# Patient Record
Sex: Female | Born: 1955 | Race: White | Hispanic: No | Marital: Married | Smoking: Never smoker
Health system: Southern US, Community
[De-identification: ages and names within clinical notes are randomized; demographics above are authoritative.]

## PROBLEM LIST (undated history)

## (undated) DIAGNOSIS — C801 Malignant (primary) neoplasm, unspecified: Secondary | ICD-10-CM

## (undated) HISTORY — DX: Malignant (primary) neoplasm, unspecified: C80.1

---

## 2000-07-29 ENCOUNTER — Other Ambulatory Visit: Admission: RE | Admit: 2000-07-29 | Discharge: 2000-07-29 | Payer: Self-pay | Admitting: Internal Medicine

## 2001-08-31 ENCOUNTER — Other Ambulatory Visit: Admission: RE | Admit: 2001-08-31 | Discharge: 2001-08-31 | Payer: Self-pay | Admitting: Internal Medicine

## 2001-09-10 ENCOUNTER — Encounter: Payer: Self-pay | Admitting: Internal Medicine

## 2001-09-10 ENCOUNTER — Encounter: Admission: RE | Admit: 2001-09-10 | Discharge: 2001-09-10 | Payer: Self-pay | Admitting: Internal Medicine

## 2001-10-06 HISTORY — PX: ABDOMINAL HYSTERECTOMY: SHX81

## 2001-11-03 ENCOUNTER — Encounter (INDEPENDENT_AMBULATORY_CARE_PROVIDER_SITE_OTHER): Payer: Self-pay | Admitting: Specialist

## 2001-11-03 ENCOUNTER — Inpatient Hospital Stay (HOSPITAL_COMMUNITY): Admission: RE | Admit: 2001-11-03 | Discharge: 2001-11-04 | Payer: Self-pay | Admitting: Obstetrics and Gynecology

## 2002-12-09 ENCOUNTER — Encounter (INDEPENDENT_AMBULATORY_CARE_PROVIDER_SITE_OTHER): Payer: Self-pay | Admitting: *Deleted

## 2002-12-09 ENCOUNTER — Ambulatory Visit (HOSPITAL_BASED_OUTPATIENT_CLINIC_OR_DEPARTMENT_OTHER): Admission: RE | Admit: 2002-12-09 | Discharge: 2002-12-09 | Payer: Self-pay | Admitting: Plastic Surgery

## 2005-09-02 ENCOUNTER — Other Ambulatory Visit: Admission: RE | Admit: 2005-09-02 | Discharge: 2005-09-02 | Payer: Self-pay | Admitting: Internal Medicine

## 2006-12-16 ENCOUNTER — Other Ambulatory Visit: Admission: RE | Admit: 2006-12-16 | Discharge: 2006-12-16 | Payer: Self-pay | Admitting: Internal Medicine

## 2007-07-29 ENCOUNTER — Ambulatory Visit: Payer: Self-pay | Admitting: Oncology

## 2007-08-13 LAB — CBC WITH DIFFERENTIAL/PLATELET
BASO%: 0.4 % (ref 0.0–2.0)
Basophils Absolute: 0 10*3/uL (ref 0.0–0.1)
Eosinophils Absolute: 0 10*3/uL (ref 0.0–0.5)
HCT: 37.4 % (ref 34.8–46.6)
HGB: 12.7 g/dL (ref 11.6–15.9)
MONO#: 0.3 10*3/uL (ref 0.1–0.9)
NEUT%: 51.2 % (ref 39.6–76.8)
Platelets: 208 10*3/uL (ref 145–400)
WBC: 3.6 10*3/uL — ABNORMAL LOW (ref 3.9–10.0)
lymph#: 1.5 10*3/uL (ref 0.9–3.3)

## 2007-08-13 LAB — COMPREHENSIVE METABOLIC PANEL
ALT: 21 U/L (ref 0–35)
BUN: 12 mg/dL (ref 6–23)
CO2: 28 mEq/L (ref 19–32)
Calcium: 9 mg/dL (ref 8.4–10.5)
Chloride: 107 mEq/L (ref 96–112)
Creatinine, Ser: 0.68 mg/dL (ref 0.40–1.20)
Glucose, Bld: 96 mg/dL (ref 70–99)

## 2007-08-31 ENCOUNTER — Ambulatory Visit (HOSPITAL_COMMUNITY): Admission: RE | Admit: 2007-08-31 | Discharge: 2007-08-31 | Payer: Self-pay | Admitting: Oncology

## 2007-09-01 ENCOUNTER — Ambulatory Visit (HOSPITAL_COMMUNITY): Admission: RE | Admit: 2007-09-01 | Discharge: 2007-09-01 | Payer: Self-pay | Admitting: General Surgery

## 2007-10-02 ENCOUNTER — Ambulatory Visit (HOSPITAL_COMMUNITY): Admission: RE | Admit: 2007-10-02 | Discharge: 2007-10-02 | Payer: Self-pay | Admitting: General Surgery

## 2007-10-02 ENCOUNTER — Encounter (INDEPENDENT_AMBULATORY_CARE_PROVIDER_SITE_OTHER): Payer: Self-pay | Admitting: General Surgery

## 2007-10-06 ENCOUNTER — Ambulatory Visit: Payer: Self-pay | Admitting: Oncology

## 2008-07-12 ENCOUNTER — Other Ambulatory Visit: Admission: RE | Admit: 2008-07-12 | Discharge: 2008-07-12 | Payer: Self-pay | Admitting: Internal Medicine

## 2008-07-12 ENCOUNTER — Ambulatory Visit: Payer: Self-pay | Admitting: Internal Medicine

## 2009-03-17 ENCOUNTER — Ambulatory Visit: Payer: Self-pay | Admitting: Internal Medicine

## 2009-07-03 IMAGING — CT NM PET TUM IMG WHOLE BODY
6 series · 25 of 25 positions shown · IV contrast ([ID])
Comparison: none

CLINICAL DATA: Melanoma staging. 
FDG PET-CT TUMOR IMAGING (WHOLE BODY):
TECHNIQUE: 18.5 mCi F-18 FDG was injected intravenously via the right antecubital fossa.  Full-ring PET imaging was performed from the skull vertex through the lower extremities 90 minutes after injection.  CT data was obtained and used for attenuation correction and anatomic localization only.  (This was not acquired as a diagnostic CT examination.)

[Series 1: pet ac legs · axial · 3.3mm · 4.69mm/px · z∈[+120,+990]mm · 5 of 267 slices shown]
[im 1/267]
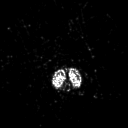
[im 67/267]
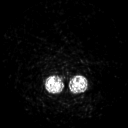
[im 134/267]
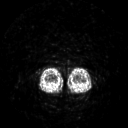
[im 200/267]
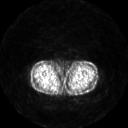
[im 267/267]
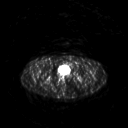

[Series 2: ct legs · axial · 3.8mm · 0.98mm/px · z∈[+120,+990]mm · 6 of 267 slices shown]
[im 1/267  soft-tissue]
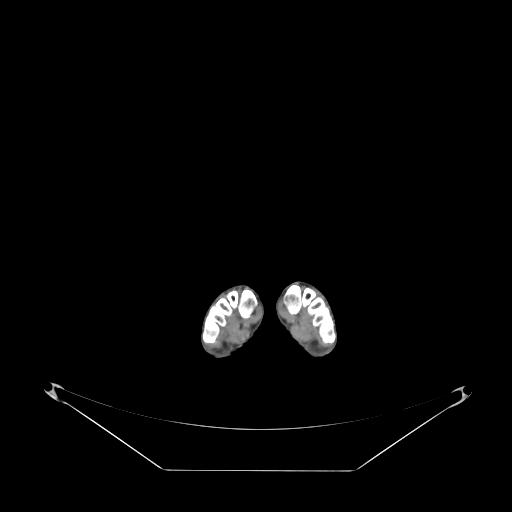
[im 54/267  soft-tissue]
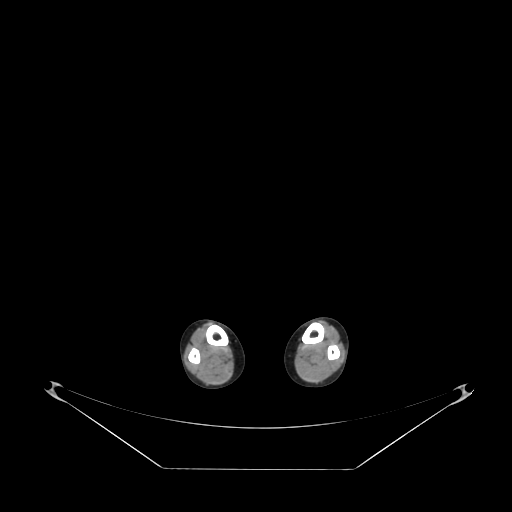
[im 107/267  soft-tissue]
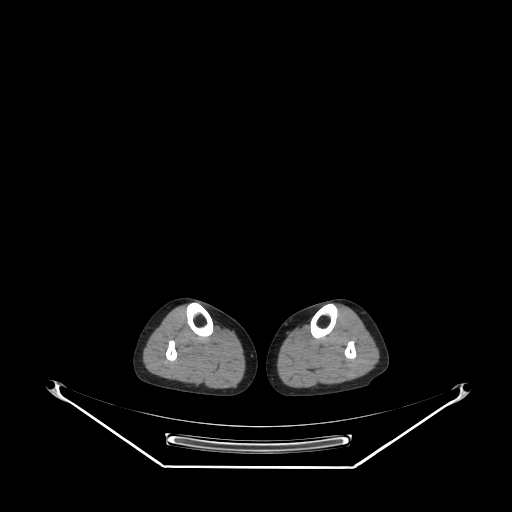
[im 160/267  soft-tissue]
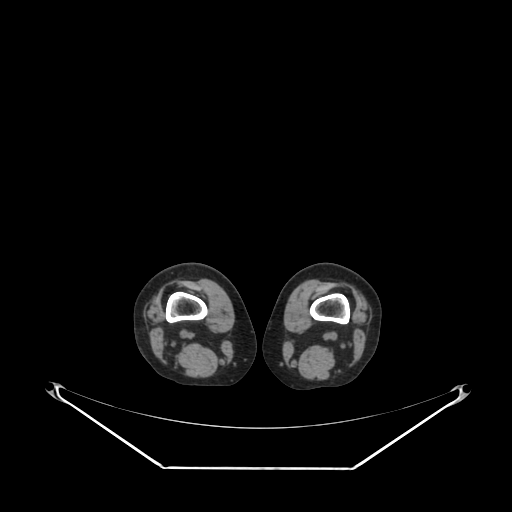
[im 213/267  soft-tissue]
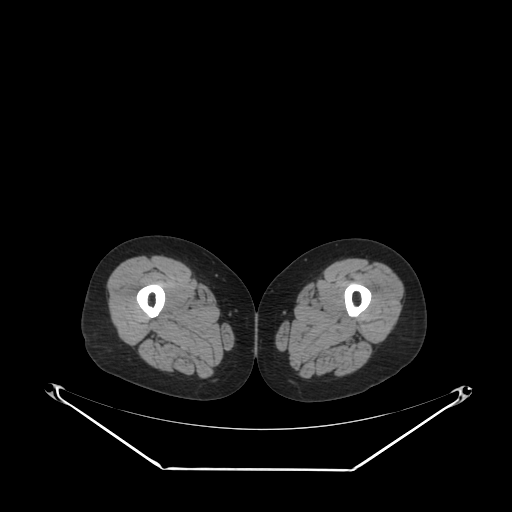
[im 267/267  soft-tissue]
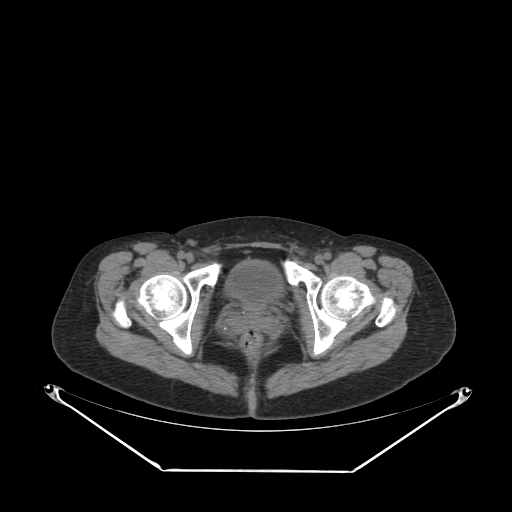

[Series 2: pet nac legs · axial · 3.3mm · 4.69mm/px · z∈[+120,+990]mm · 6 of 267 slices shown]
[im 1/267]
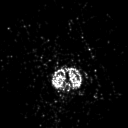
[im 54/267]
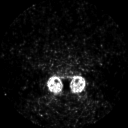
[im 107/267]
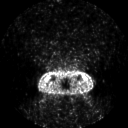
[im 160/267]
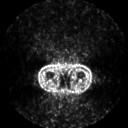
[im 213/267]
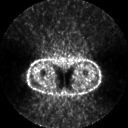
[im 267/267]
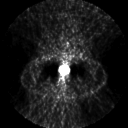

[Series 123: mip · coronal · 3.3mm · 4.69mm/px · 1 of 30 slices shown]
[im 1/30]
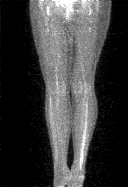

[Series 151: reformatted · axial · 3.3mm · 3.91mm/px · z∈[+134,+974]mm · 6 of 258 slices shown (1 of 2)]
[im 1/258]
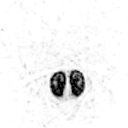
[im 52/258]
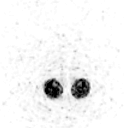
[im 103/258]
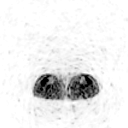
[im 155/258]
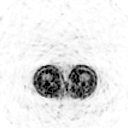
[im 206/258]
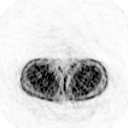
[im 258/258]
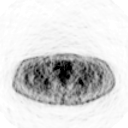

[Series 153: reformatted · coronal · 4.7mm · 6.98mm/px · 1 of 38 slices shown (2 of 2)]
[im 1/38]
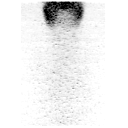

[25 of 25 positions shown; findings below may reference images not displayed]

FINDINGS: By report, the patient had melanoma of the lateral aspect of the left leg.  The technologist reported that this was in the distal leg, just above the ankle, but on today?s PET scan, there is a linear focus of increased FDG uptake in the skin and superficial subcutaneous fat of the more proximal aspect of the lateral left leg, just distal to the knee.  Given the linear configuration, I suspect this uptake is related to healing of the resection site.  However, clinical correlation is recommended.  Elsewhere in the neck, chest, abdomen, and pelvis, there is a focal area of FDG accumulation along the left pelvic side wall.  This corresponds to image 224 of the CT data.  The activity is superimposed on the left ureter as it courses along the left pelvic side wall and I can identify no lymph node in this area.  For this reason, I believe the activity is related to urine within the distal ureter rather than metastatic spread to a pelvic side wall lymph node.  Of note, there are no hypermetabolic lymph nodes in the proximal left thigh or left inguinal region. 
No other areas of unexpected or suspicious FDG accumulation are seen in the neck, chest, abdomen, or pelvis.  
Although not a diagnostic CT study, the patient does have bilateral tiny pulmonary nodules.  A right middle lobe and left lower lobe pulmonary nodule are both visible on image 123 of the CT scan.  These are below the expected size threshold for PET resolution.  1 cm hypodense lesion in the right kidney is not hypermetabolic on the PET scan.  Bilateral pars interarticularis defects are seen at L5.
IMPRESSION: 1.  There is a linear focus of hypermetabolic uptake in the skin and subcutaneous superficial fat over the anterolateral aspect of the proximal left leg.  This may correspond to the patient?s resection site, but clinical correlation is recommended. 
2.  Single focus of FDG accumulation above background levels along the left pelvic side wall is felt to be excreted contrast within the distal left ureter. 
3.  Tiny bilateral pulmonary parenchymal nodules, too small to characterize and below the size threshold for PET resolution.

## 2010-07-29 ENCOUNTER — Encounter: Payer: Self-pay | Admitting: Oncology

## 2010-11-20 NOTE — Op Note (Signed)
NAMEALAINNA, Valerie Eaton              ACCOUNT NO.:  [id number]   MEDICAL RECORD NO.:  [id number]          PATIENT TYPE:  AMB   LOCATION:  SDS                          FACILITY:  [facility]   PHYSICIAN:  Gabrielle Dare. Janee Morn, M.D.DATE OF BIRTH:  03/28/1956   DATE OF PROCEDURE:  10/02/2007  DATE OF DISCHARGE:                               OPERATIVE REPORT   PREOPERATIVE DIAGNOSES:  Melanoma, left calf, status post wide excision.   POSTOPERATIVE DIAGNOSES:  Melanoma, left calf, status post wide  excision.   PROCEDURE:  Left inguinal and sentinel lymph node biopsy with blue dye  injection.   SURGEON:  Gabrielle Dare. Janee Morn, M.D.   ANESTHESIA:  General with laryngeal mask airway.   HISTORY OF PRESENT ILLNESS:  Valerie Eaton is a 55 year old female, who  underwent wide excision of a melanoma on her left calf by Dr. Benna Dunks  from plastic surgery.  Pathology revealed clear margins.  However, the  thickness of the melanoma was 1.03 mm.  She was evaluated by Dr.  Darnelle Catalan from oncology and I was asked to do a sentinel lymph node  biopsy.  She underwent lymph scintigram, which demonstrated sentinel  lymph node in the left inguinal region, so we are proceeding today with  elective left inguinal sentinel lymph node biopsy.  She was injected  with a radionucleotide this morning.   PROCEDURE IN DETAIL:  Informed consent was obtained.  Patient received  intravenous antibiotics.  Her site was marked.  She was brought to the  operating room.  General anesthesia with laryngeal mask airway was  administered by the anesthesia staff.  Diluted methylene blue was  injected cutaneously around her wide excision site and then massaged  gently for four minutes.  We injected a total of 3 mL.  The Neoprobe was  then used to confirm injection of the radionucleotide at the previous  wide-excision site and to localize a spot in the groin on the left side.  Left groin was then prepped and draped in sterile fashion.  Neoprobe  was  again used to localize a hot node location.  Subsequently, a transverse  incision was made along the tissue plane lines.  Subcutaneous tissues  were dissected down through Scarpa's fascia.  We then used the Neoprobe  to guide our dissection and a blue-tinged lymph node was located easily.  This was circumferentially dissected and hemostasis was obtained with  the Bovie cautery.  This was sent to pathology as a hot blue node.  Neoprobe readings were about 325.  Further exploration of this incision  in the surrounding groin area revealed no other signal above 4, so this  completed the procedure.  The area was  irrigated; 0.25% Marcaine with epinephrine was injected for local  anesthetic.  Hemostasis was insured.  Wound was closed with a running 4-  0 Monocryl subcuticular stitch, followed by Dermabond.  The patient  tolerated the procedure well, without apparent complication and was  taken to the recovery room in stable condition.      Gabrielle Dare Janee Morn, M.D.  Electronically Signed     BET/MEDQ  D:  10/02/2007  T:  10/02/2007  Job:  [id number]   cc:   Alfredia Ferguson, M.D.  Valentino Hue. Magrinat, M.D.

## 2010-11-23 NOTE — Op Note (Signed)
[facility]  Patient:    Valerie Eaton, Valerie Eaton Visit Number: [id number] MRN: [id number]          Service Type: GYN Location: [id number] Attending Physician:  Jenean Lindau Dictated by:   Laqueta Linden, M.D. Proc. Date: 11/03/01 Admit Date:  11/03/2001   CC:         Luanna Cole. Lenord Fellers, M.D.   Operative Report  PREOPERATIVE DIAGNOSES:       Leiomyomata uteri.  POSTOPERATIVE DIAGNOSES:      Leiomyomata uteri.  PROCEDURE:                    Total abdominal hysterectomy.  SURGEON:                      Laqueta Linden, M.D.  ASSISTANT:                    Darden Dates. Clelia Croft, M.D.  ANESTHESIA:                   Epidural/spinal with IV sedation.  ESTIMATED BLOOD LOSS:         150 cc.  URINE OUTPUT:                 650 cc.  FLUIDS:                       2 L Crystalloid.  COUNTS:                       Correct x2.  COMPLICATIONS:                None.  INDICATIONS:                  [name] is a 55 year old nulligravida female whose husband is status post vasectomy who presented upon referral from Dr. Lenord Fellers for a large fibroid uterus which reportedly increased in size over a one year period of time.  She noted pelvic pressure and heaviness as well as a full abdomen, urinary frequency, and worsening menorrhagia over the past six to seven months.  She reportedly had a normal yearly examination one year previously.  At the time she presented in March 2003 she was noted to have a 16-18 week sized uterus with ultrasound confirming a large exophytic fibroid measuring 8 x 10.7 x 10.2 cm with a grossly enlarged uterus and multiple other fibroids.  Patient was counseled regarding management alternatives including embolization versus hysterectomy. She elected to proceed to definitive surgical management.  She has seen the informed consent film and been extensively counseled as to the risks, benefits, alternatives, and complications of the  procedure including, but not limited to, anesthesia risks, infection, bleeding possibly requiring transfusion, injury to bowel, bladder, ureters, vessels, nerves, the possibility of fistula formation, other surgical risks including DVT, PE, pneumonia, death as well as postoperative expectations regarding sexual functioning, eventual menopause, and return to work.  She has seen the informed consent film, has voiced her understanding and acceptance of all risks, and agrees to proceed.  She received Ancef 1 g IV antibiotic prophylaxis preoperatively.  PROCEDURE:                    The patient was taken to the operating room and after proper identification and consents were ascertained she was placed on the operating table in  supine position.  A spinal/epidural was placed by Dr. Arby Barrette at which point the patient was returned to the supine position. After an adequate level of anesthesia was ascertained she was placed in the frog leg position and the abdomen, perineum, and vagina were prepped and draped in a routine sterile fashion.  PAS stockings were in place.  A transurethral Foley was placed.  A low transverse incision was then made just above the pubic symphysis.  This was carried down to the level of the anterior rectus fascia.  The fascia was incised and the rectus muscles separated.  The parietoperitoneum was elevated and incised and the incision extended superiorly and inferiorly to the level of the bladder.  Gentle palpation of the upper abdomen revealed smooth renal contours bilaterally with smooth liver edge and no obvious gallstones.  The appendix was retrocecal, but was visualized in its entirety and palpated with no obvious stones or adhesions identified.  Self retaining retractor was placed and the bowel packed in the upper abdomen using moistened lap packs.  The uterus was noted to be distorted by a very large fibroid which consumed the fundus of the uterus.  Both tubes and  ovaries appeared completely normal.  A corkscrew was inserted into the fundus of the uterus to allow the fundus to be delivered through the incision which markedly improved visualization of the attaching ligaments.  The round ligaments bilaterally were clamped, cut, and suture ligated with 0 Vicryl suture.  The broad ligament was then opened anteriorly and posteriorly with visualization of the ureters bilaterally which were of normal caliber and peristalsing normally.  A window was made in the posterior broad ligament and a curved Kelly clamp placed across the proximal utero-ovarian ligament and fallopian tube on both sides with retention of both adnexa.  This pedicle was cut and doubly ligated with a free tie and a stitch of 0 Vicryl.  At the end of the procedure the adnexa were suspended to the ipsilateral round ligaments. The uterine vessels were then skeletonized.  The bladder flap was then advanced sharply and bluntly off of the lower uterine segment and cervix. The uterine vessels were clamped, cut, and suture ligated with 0 Vicryl.  An additional pedicle was taken along the cardinal ligament which was cut and suture ligated incorporating the previous ligature such that the uterine vessels were doubly ligated.  The bladder was further advanced and an additional pedicle was taken on the cardinal ligament bilaterally.  At this point the cervix was transected from the uterus to allow for better visualization.  The cardinal ligaments were clamped to the level of the upper vaginal angles and curved Heaney clamps were then placed across the upper vaginal angle and uterosacral ligaments bilaterally with pedicles cut and suture ligated in a routine fashion.  The Satinsky scissors were then used to circumscribe the cervix with remainder of the specimen removed and sent with the uterine fundus in its entirety.  The vaginal angles were ligated in a routine Richardson angle suture using 0 Vicryl.   The remainder of the vaginal  cuff was closed with interrupted figure-of-eight sutures of 0 Vicryl.  The uterosacral ligaments were plicated posteriorly.  Copious lavage was accomplished.  Several small bleeding points were cauterized.  The bladder rem intact and had no active bleeding from the bladder flap.  All pedicles were inspected and were hemostatic.  Both ureters were visualized and of normal caliber and peristalsing normally at the conclusion of the procedure.  All lap packs  were then removed.  Counts were correct prior to closure of the abdomen. The parietoperitoneum was closed in a running fashion with 2-0 Vicryl suture. The rectus muscles were loosely reapproximated in the midline.  After subfascial hemostasis was ascertained the fascia was closed from both lateral aspects to the midline using a running stitch of 0 Maxon.  Subcutaneous hemostasis was ascertained.  Skin clips and Steri-Strips were placed followed by a pressure dressing.  The patient was stable and awake on transfer to the recovery room.  Estimated blood loss 150 cc.  Urine output 650 cc.  Fluids 2 L of Crystalloid. Counts correct x2.  Complications none. Dictated by:   Laqueta Linden, M.D. Attending Physician:  Jenean Lindau DD:  11/03/01 TD:  11/03/01 Job: [id number]

## 2010-11-23 NOTE — Op Note (Signed)
   Valerie Eaton, Valerie Eaton                          ACCOUNT NO.:  [id number]   MEDICAL RECORD NO.:  [id number]                   PATIENT TYPE:  AMB   LOCATION:  DSC                                  FACILITY:  [facility]   PHYSICIAN:  Alfredia Ferguson, M.D.               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   OPERATION:  Elliptical excision of verrucous vulgaris, right upper forehead.   SURGEON:  Alfredia Ferguson, M.D.   ANESTHESIA:  Xylocaine 2% with 1:100,000 epinephrine.   INDICATIONS FOR PROCEDURE:  This is a 55 year old woman who had a biopsy of  a scaly lesion done about a week ago which returned verrucous vulgaris with  atypia.  The lesion has grown fairly rapidly and become ulcerated.  She  wishes to have it removed.  She understands she will be trading what she has  for permanent and potentially unsightly scar.  In spite of that the patient  wished to proceed with the surgery.   PROCEDURE IN DETAIL:  Skin markers were placed in elliptical fashion around  the lesion and local anesthesia was infiltrated.  The area was prepped and  draped in a sterile fashion.  After waiting approximately five minutes an  elliptical excision of the lesion down to the level of the subcutaneous  tissue was carried out.  Hemostasis was accomplished using electrocautery.  The wound was closed and approximated in the dermis using interrupted 5-0  Monocryl suture.  The skin was united using a running 6-0 nylon suture.  Light dressing was applied and the patient was discharged to home in  satisfactory condition.                                               Alfredia Ferguson, M.D.    WBB/MEDQ  D:  12/09/2002  T:  12/09/2002  Job:  [id number]

## 2010-12-21 ENCOUNTER — Encounter: Payer: Self-pay | Admitting: Internal Medicine

## 2011-03-12 ENCOUNTER — Encounter: Payer: Self-pay | Admitting: Internal Medicine

## 2011-03-12 ENCOUNTER — Ambulatory Visit (INDEPENDENT_AMBULATORY_CARE_PROVIDER_SITE_OTHER): Payer: BC Managed Care – PPO | Admitting: Internal Medicine

## 2011-03-12 ENCOUNTER — Other Ambulatory Visit (HOSPITAL_COMMUNITY)
Admission: RE | Admit: 2011-03-12 | Discharge: 2011-03-12 | Disposition: A | Discharge status: Home / Self Care | Payer: BC Managed Care – PPO | Source: Ambulatory Visit | Attending: Internal Medicine | Admitting: Internal Medicine

## 2011-03-12 VITALS — BP 116/82 | HR 80 | Temp 97.5°F | Ht 66.0 in | Wt 152.0 lb

## 2011-03-12 DIAGNOSIS — Z85820 Personal history of malignant melanoma of skin: Secondary | ICD-10-CM

## 2011-03-12 DIAGNOSIS — Z01419 Encounter for gynecological examination (general) (routine) without abnormal findings: Secondary | ICD-10-CM | POA: Insufficient documentation

## 2011-03-12 DIAGNOSIS — Z0000 Encounter for general adult medical examination without abnormal findings: Secondary | ICD-10-CM

## 2011-03-12 DIAGNOSIS — Z124 Encounter for screening for malignant neoplasm of cervix: Secondary | ICD-10-CM

## 2011-03-12 LAB — COMPREHENSIVE METABOLIC PANEL
Albumin: 4.2 g/dL (ref 3.5–5.2)
Alkaline Phosphatase: 35 U/L — ABNORMAL LOW (ref 39–117)
BUN: 15 mg/dL (ref 6–23)
Calcium: 8.8 mg/dL (ref 8.4–10.5)
Glucose, Bld: 88 mg/dL (ref 70–99)
Potassium: 4.2 mEq/L (ref 3.5–5.3)

## 2011-03-12 LAB — POCT URINALYSIS DIPSTICK
Bilirubin, UA: NEGATIVE
Blood, UA: NEGATIVE
Leukocytes, UA: NEGATIVE
Nitrite, UA: NEGATIVE
Protein, UA: NEGATIVE
pH, UA: 6

## 2011-03-12 LAB — CBC WITH DIFFERENTIAL/PLATELET
Hemoglobin: 12.9 g/dL (ref 12.0–15.0)
Lymphs Abs: 1.1 10*3/uL (ref 0.7–4.0)
MCH: 32.3 pg (ref 26.0–34.0)
Monocytes Relative: 9 % (ref 3–12)
Neutro Abs: 1.5 10*3/uL — ABNORMAL LOW (ref 1.7–7.7)
Neutrophils Relative %: 52 % (ref 43–77)
RBC: 3.99 MIL/uL (ref 3.87–5.11)

## 2011-03-12 LAB — VITAMIN D 25 HYDROXY (VIT D DEFICIENCY, FRACTURES): Vit D, 25-Hydroxy: 37 ng/mL (ref 30–89)

## 2011-03-12 LAB — LIPID PANEL
Cholesterol: 206 mg/dL — ABNORMAL HIGH (ref 0–200)
Total CHOL/HDL Ratio: 2.4 Ratio
Triglycerides: 65 mg/dL (ref <150)

## 2011-03-12 LAB — TSH: TSH: 2.405 u[IU]/mL (ref 0.350–4.500)

## 2011-03-14 ENCOUNTER — Encounter: Payer: Self-pay | Admitting: Internal Medicine

## 2011-03-25 ENCOUNTER — Encounter: Payer: Self-pay | Admitting: Internal Medicine

## 2011-03-25 ENCOUNTER — Telehealth: Payer: Self-pay | Admitting: Internal Medicine

## 2011-03-25 NOTE — Telephone Encounter (Signed)
Pt advised per Dr. Lenord Fellers that there was no significant change in CBC.  Advised to schedule appt & have CBC again in 6 wks.  Pt verbalized understanding and scheduled appt.

## 2011-03-25 NOTE — Telephone Encounter (Signed)
CBC done at outside lab shows WBC 3300 which is no significant change. Suggest we repeat in 6 weeks with office visit.

## 2011-03-31 DIAGNOSIS — Z85820 Personal history of malignant melanoma of skin: Secondary | ICD-10-CM | POA: Insufficient documentation

## 2011-03-31 NOTE — Progress Notes (Signed)
  Subjective:    Patient ID: Valerie Eaton, female    DOB: 10/13/1955, 55 y.o.   MRN: [id number]  HPI 55 year old white female in today for health maintenance. 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 Remote history of fractured left patella. Last mammogram mobile March 2011. Gets annual influenza immunization. Last tetanus immunization 2004. Had abdominal hysterectomy without oophorectomy April 2003. No known drug allergies. Patient saw Dr. Daneil Dolin not regarding her melanoma in the spring of 2009. She had a PET scan showing only postoperative uptake at the site of her surgery. She had a wide excision done of the left calf 07/22/2007. Final pathology showed negative margins. Rezulin depth was 1.03 mm. No evidence of node involvement. Staged as T2aN0M0 malignant melanoma. Sees Dr. Terri Piedra for biyearly screening exams.    Review of Systems  Constitutional: Negative.   HENT: Negative.   Eyes: Negative.   Respiratory: Negative.   Cardiovascular: Negative.   Gastrointestinal: Negative.   Genitourinary: Negative.   Musculoskeletal: Negative.   Neurological: Negative.   Hematological: Negative.   Psychiatric/Behavioral: Negative.        Objective:   Physical Exam  Nursing note and vitals reviewed. Constitutional: She is oriented to person, place, and time. She appears well-developed and well-nourished.  HENT:  Head: Normocephalic and atraumatic.  Right Ear: External ear normal.  Left Ear: External ear normal.  Mouth/Throat: Oropharynx is clear and moist.  Eyes: Conjunctivae and EOM are normal. Pupils are equal, round, and reactive to light. Right eye exhibits no discharge. Left eye exhibits no discharge. No scleral icterus.  Neck: Neck supple. No JVD present. No thyromegaly present.  Cardiovascular: Normal rate, regular rhythm, normal heart sounds and intact distal pulses.   No murmur heard. Pulmonary/Chest: Effort normal and breath sounds normal. She has no wheezes.    Abdominal: She exhibits no distension and no mass. There is no tenderness. There is no rebound and no guarding.  Musculoskeletal: Normal range of motion. She exhibits no edema.  Lymphadenopathy:    She has no cervical adenopathy.  Neurological: She is alert and oriented to person, place, and time. She has normal reflexes. No cranial nerve deficit. Coordination normal.  Skin: Skin is warm and dry. No rash noted. No erythema. No pallor.  Psychiatric: She has a normal mood and affect.          Assessment & Plan:  Normal health maintenance exam  History of Clark's level IV malignant melanoma left leg 2009 with negative margins on wide excision.  Needs screening colonoscopy. Did have flexible sigmoidoscopy 1993 at [facility] in [place].  Has noted low white blood cell count compared to previous white blood cell count. CBC shows white blood cell count of 2900 on 94 coiled. She denies any recent viral illness, URI, et Karie Soda. Previous CBCs have shown white blood cell count of 4400 in January 2010, 3900 June 2008, 4500 March 2007 and 3600 may 2004. This all may be an erroneous result. She will have repeat lab work done in a couple of weeks.

## 2011-04-01 LAB — CBC
Hemoglobin: 12.6
MCHC: 34.7
Platelets: 171
RDW: 12.7

## 2012-03-16 ENCOUNTER — Ambulatory Visit (INDEPENDENT_AMBULATORY_CARE_PROVIDER_SITE_OTHER): Payer: BC Managed Care – PPO | Admitting: Internal Medicine

## 2012-03-16 ENCOUNTER — Encounter: Payer: Self-pay | Admitting: Internal Medicine

## 2012-03-16 VITALS — BP 122/84 | HR 80 | Temp 97.0°F | Ht 65.25 in | Wt 142.0 lb

## 2012-03-16 DIAGNOSIS — Z85820 Personal history of malignant melanoma of skin: Secondary | ICD-10-CM

## 2012-03-16 DIAGNOSIS — Z124 Encounter for screening for malignant neoplasm of cervix: Secondary | ICD-10-CM

## 2012-03-17 ENCOUNTER — Other Ambulatory Visit (HOSPITAL_COMMUNITY)
Admission: RE | Admit: 2012-03-17 | Discharge: 2012-03-17 | Disposition: A | Discharge status: Home / Self Care | Payer: BC Managed Care – PPO | Source: Ambulatory Visit | Attending: Internal Medicine | Admitting: Internal Medicine

## 2012-03-17 DIAGNOSIS — Z01419 Encounter for gynecological examination (general) (routine) without abnormal findings: Secondary | ICD-10-CM | POA: Insufficient documentation

## 2012-04-06 ENCOUNTER — Encounter: Payer: Self-pay | Admitting: Internal Medicine

## 2012-04-06 NOTE — Patient Instructions (Addendum)
Return in one year.

## 2012-04-06 NOTE — Progress Notes (Signed)
Subjective:    Patient ID: Valerie Eaton, female    DOB: 04-18-1956, 56 y.o.   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 General health is excellent. Patient watches her diet and exercises. She her husband have recently purchased a boat which they keep on [address]. [place]. 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  In September 2012 she had a CBC done showed a white blood cell count of 2900. If worse repeated a couple of weeks later and 1 blood cell count 3300. Looking back on previous CBCs in 2008 she had a white blood cell count of 3900. July 2000 she had a white blood cell count of 3519 99 white blood cell count of 3600. Highest white blood cell count on file was 4500 March 2007.  In 2009 she had a lesion on her left calf noted by her plastic surgeon which was biopsied and was found to be a melanoma. A wide excision was performed 07/22/2007. Tumor had Clark level of 4 and a Breslow depth of 1.03 mm. Margins were negative. She subsequently had a PET scan that was normal.  She has been seen by Dr. Darnelle Catalan in 2009 for melanoma evaluation.. She had an abdominal hysterectomy without oophorectomy in April 2003. History of fractured left patella in 2003. Gets annual influenza immunization. Had tetanus immunization 11/15/2002.  Had colonoscopy done October 24, 2008 by Dr. Loreta Ave showing tubular adenoma.  Social history: Married for many years. No children. 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 Does not smoke. Social alcohol consumption. Has a 4 year college education.  Family history: Father died at age 20 do to a drunk driver car collision  2 brothers and one sister in good health. Mother with history of nervous breakdown, suicide  and colon cancer. Mother also had history of diabetes. Mother also had history of hypothyroidism.    Review of Systems  Constitutional: Negative.   All other systems reviewed and are negative.       Objective:   Physical Exam  Vitals reviewed. Constitutional: She is oriented to person, place, and time. She appears well-developed and well-nourished. No distress.  HENT:  Head: Normocephalic and atraumatic.  Right Ear: External ear normal.  Left Ear: External ear normal.  Mouth/Throat: Oropharynx is clear and moist. No oropharyngeal exudate.  Eyes: Conjunctivae normal and EOM are normal. Pupils are equal, round, and reactive to light. Right eye exhibits no discharge. Left eye exhibits no discharge. No scleral icterus.  Neck: Neck supple. No JVD present. No thyromegaly present.  Cardiovascular: Normal rate, regular rhythm, normal heart sounds and intact distal pulses.   No murmur heard. Pulmonary/Chest: Effort normal and breath sounds normal.       Breasts normal female  Abdominal: Soft. Bowel sounds are normal. She exhibits no distension and no mass. There is no tenderness. There is no rebound and no guarding.  Genitourinary:       Hysterectomy without oophorectomy. Bimanual is normal.  Musculoskeletal: She exhibits no edema.  Lymphadenopathy:    She has no cervical adenopathy.  Neurological: She is alert and oriented to person, place, and time. She has normal reflexes. No cranial nerve deficit. Coordination normal.  Skin: Skin is warm and dry. No rash noted.  She is not diaphoretic.  Psychiatric: She has a normal mood and affect. Her behavior is normal. Judgment and thought content normal.          Assessment & Plan:  History of Clark level IV melanoma left leg 2009  Status post, hysterectomy without oophorectomy April 2003  Plan: Return one year or as needed.

## 2013-07-12 ENCOUNTER — Telehealth: Payer: Self-pay | Admitting: Internal Medicine

## 2013-07-12 NOTE — Telephone Encounter (Signed)
Called patient to see what plans have been made for her by emergency department in [place]. She has appointment see physician at [facility] about abnormal CT scan of abdomen this coming Thursday, January 8. She will let me know after she sees that physician.

## 2013-07-12 NOTE — Telephone Encounter (Signed)
Phone call from ED physician at [facility] in [place] regarding 3 month hx of abd pain. First time I have heard of this. Apparently has an abnormal CT suspicious for ovarian CA.   CA 125 obtained. Asked that results be faxed here and consider eval at [facility].

## 2013-08-05 ENCOUNTER — Telehealth: Payer: Self-pay | Admitting: Internal Medicine

## 2013-08-05 NOTE — Telephone Encounter (Signed)
Spoke by phone to husband. They are to return today to [facility] for an appt and results.

## 2013-10-12 ENCOUNTER — Encounter: Payer: Self-pay | Admitting: Internal Medicine

## 2013-10-12 ENCOUNTER — Ambulatory Visit (INDEPENDENT_AMBULATORY_CARE_PROVIDER_SITE_OTHER): Payer: BC Managed Care – PPO | Admitting: Internal Medicine

## 2013-10-12 VITALS — BP 156/100 | HR 80 | Temp 98.8°F | Wt 138.0 lb

## 2013-10-12 DIAGNOSIS — D649 Anemia, unspecified: Secondary | ICD-10-CM

## 2013-10-12 DIAGNOSIS — R030 Elevated blood-pressure reading, without diagnosis of hypertension: Secondary | ICD-10-CM

## 2013-10-12 DIAGNOSIS — IMO0001 Reserved for inherently not codable concepts without codable children: Secondary | ICD-10-CM

## 2013-10-12 DIAGNOSIS — D709 Neutropenia, unspecified: Secondary | ICD-10-CM

## 2013-10-12 DIAGNOSIS — C569 Malignant neoplasm of unspecified ovary: Secondary | ICD-10-CM

## 2013-10-12 MED ORDER — AMLODIPINE BESYLATE 5 MG PO TABS: 5.0000 mg | ORAL_TABLET | Freq: Every day | ORAL | Status: DC

## 2013-10-12 NOTE — Progress Notes (Signed)
   Subjective:    Patient ID: Valerie Eaton, female    DOB: December 17, 1955, 58 y.o.   MRN: [id number]  HPI  Patient here regarding elevated blood pressure recently noted. One of her chemotherapy agents as Avastin which is been associated with elevated blood pressure. Patient was diagnosed recently with stage III ovarian cancer has been treated at [facility]. She's had several chemotherapy treatments but the one this weekend be postponed because of low counts. Her spirits are good. She feels well and looks great. She has lost her hair and is wearing a wig.    Review of Systems     Objective:   Physical Exam No thyromegaly. Chest clear. Cardiac exam regular rate and rhythm normal S1 and S2. Extremities without edema.       Assessment & Plan:  Elevated blood pressure likely due to Avastin therapy  Plan: Norvasc 5 mg daily. She is a Equities trader by training and is able to monitor her blood pressure at home. Would like for her to call me with blood pressure readings in one week.  25 minutes spent with patient. She gave me a followup of all of her treatment since her recent diagnosis including surgery.

## 2013-10-12 NOTE — Patient Instructions (Addendum)
Call me with BP readings in  One week after starting Norvasc 5 mg daily.

## 2013-10-31 DIAGNOSIS — C569 Malignant neoplasm of unspecified ovary: Secondary | ICD-10-CM | POA: Insufficient documentation

## 2014-02-08 ENCOUNTER — Other Ambulatory Visit: Payer: Self-pay | Admitting: Internal Medicine

## 2014-03-23 ENCOUNTER — Encounter: Payer: Self-pay | Admitting: Internal Medicine

## 2014-03-31 ENCOUNTER — Telehealth: Payer: Self-pay

## 2014-03-31 NOTE — Telephone Encounter (Signed)
Left message for patient to call office regarding her normal mammogram.

## 2014-04-26 ENCOUNTER — Encounter: Payer: Self-pay | Admitting: Internal Medicine

## 2014-08-16 ENCOUNTER — Other Ambulatory Visit: Payer: Self-pay | Admitting: Internal Medicine

## 2015-02-06 ENCOUNTER — Other Ambulatory Visit: Payer: Self-pay | Admitting: Internal Medicine

## 2015-02-06 NOTE — Telephone Encounter (Signed)
Please call her and see how BP is doing. Will need OV sometime in next few weeks to check in.

## 2015-03-07 ENCOUNTER — Other Ambulatory Visit: Payer: Self-pay | Admitting: Internal Medicine

## 2015-03-23 ENCOUNTER — Ambulatory Visit (INDEPENDENT_AMBULATORY_CARE_PROVIDER_SITE_OTHER): Payer: BLUE CROSS/BLUE SHIELD | Admitting: Internal Medicine

## 2015-03-23 ENCOUNTER — Encounter: Payer: Self-pay | Admitting: Internal Medicine

## 2015-03-23 VITALS — BP 132/80 | HR 80 | Temp 97.2°F | Wt 158.0 lb

## 2015-03-23 DIAGNOSIS — Z85820 Personal history of malignant melanoma of skin: Secondary | ICD-10-CM

## 2015-03-23 DIAGNOSIS — M8580 Other specified disorders of bone density and structure, unspecified site: Secondary | ICD-10-CM | POA: Diagnosis not present

## 2015-03-23 DIAGNOSIS — Z1509 Genetic susceptibility to other malignant neoplasm: Secondary | ICD-10-CM

## 2015-03-23 DIAGNOSIS — Z1501 Genetic susceptibility to malignant neoplasm of breast: Secondary | ICD-10-CM | POA: Diagnosis not present

## 2015-03-23 DIAGNOSIS — C569 Malignant neoplasm of unspecified ovary: Secondary | ICD-10-CM

## 2015-03-23 DIAGNOSIS — I10 Essential (primary) hypertension: Secondary | ICD-10-CM

## 2015-03-23 NOTE — Patient Instructions (Addendum)
Flu shot received yesterday at [facility]. Continue amlodipine until after [place] then try to discontinue amlodipine and follow blood pressures at home. I think you still might need amlodipine. Return in 6 months.

## 2015-04-05 ENCOUNTER — Encounter: Payer: Self-pay | Admitting: Internal Medicine

## 2015-04-05 DIAGNOSIS — Z1509 Genetic susceptibility to other malignant neoplasm: Secondary | ICD-10-CM

## 2015-04-05 DIAGNOSIS — Z85820 Personal history of malignant melanoma of skin: Secondary | ICD-10-CM | POA: Insufficient documentation

## 2015-04-05 DIAGNOSIS — Z1501 Genetic susceptibility to malignant neoplasm of breast: Secondary | ICD-10-CM | POA: Insufficient documentation

## 2015-04-05 DIAGNOSIS — I10 Essential (primary) hypertension: Secondary | ICD-10-CM | POA: Insufficient documentation

## 2015-04-05 DIAGNOSIS — Z15068 Genetic susceptibility to other malignant neoplasm of digestive system: Secondary | ICD-10-CM | POA: Insufficient documentation

## 2015-04-05 NOTE — Progress Notes (Signed)
   Subjective:    Patient ID: Valerie Eaton, female    DOB: 04/10/1956, 59 y.o.   MRN: [id number]  HPI  She has a history of ovarian cancer and is under the care of Oncologist, Dr. Lentz, at [facility]. She has been on amlodipine for hypertension now for about a year. She would like to come off the medication but I'm not sure it's a good idea at this point in time. She has been placed on Femara. She is due for mammogram in the near future. Also due for bone density study. Bone density study done in 2012 was not statistically significant from bone density study done in 2010. Recent bone density study at [facility] is stable. Would not put her on bisphosphonate therapy at this time. 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 Underwent BSO and debulking surgery. Had hysterectomy 2003. Subsequently treated with carboplatin and Taxol as well as Avastin. She is BRCA-1 positive.  She has a good attitude. She feels pretty well. She's followed with serial CT scans frequently.  Blood pressure stable at this point time. 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 This time they'll be there for about a  month  No known drug allergies  History of fractured left patella  History of Clark's level IV melanoma left leg 2009. Saw Dr. Magrinat in consultation.    Social history: Married no children.  Father died at age 30. He was struck by a drunk driver in a head-on collision. Mother with history of hypothyroidism and colon cancer. Her colon cancer was diagnosed at 51. 2 brothers reportedly in good health. Sister with history of hypothyroidism.   Review of Systems     Objective:   Physical Exam Skin warm and dry. Nodes none. Chest clear. Cardiac exam regular rate and rhythm. Extremities without edema.       Assessment & Plan:  Hypertension-likely related to Avastin  therapy  Mild osteopenia-continue to monitor  History of ovarian cancer treated at [facility]  Plan: Think she should continue on with amlodipine for nail. We can consider stopping it and monitoring her blood pressure after she gets back from [place] she if she so desires. However she has gained some weight and I'm not sure she'll be able to get off this medication. 

## 2015-07-04 ENCOUNTER — Telehealth: Payer: Self-pay | Admitting: Internal Medicine

## 2015-07-04 NOTE — Telephone Encounter (Signed)
Patient contacted me to let me know she had been [facility] today to have repeat CT scan of abdomen in follow-up of ovarian cancer. There is reportedly a lesion on her sigmoid colon they are concerned about and her CA 125 is 46 she says. They are going to consider an oral chemotherapeutic agent. She has an  appointment there this coming Thursday for further discussion with her oncologist.

## 2015-11-28 ENCOUNTER — Other Ambulatory Visit: Payer: Self-pay | Admitting: Internal Medicine

## 2016-01-08 DIAGNOSIS — Z029 Encounter for administrative examinations, unspecified: Secondary | ICD-10-CM

## 2016-03-13 ENCOUNTER — Other Ambulatory Visit: Payer: Self-pay | Admitting: Internal Medicine

## 2016-04-04 ENCOUNTER — Encounter: Payer: Self-pay | Admitting: Internal Medicine

## 2016-04-08 ENCOUNTER — Encounter: Payer: Self-pay | Admitting: Internal Medicine

## 2016-06-10 ENCOUNTER — Other Ambulatory Visit: Payer: Self-pay | Admitting: Internal Medicine

## 2016-09-14 ENCOUNTER — Other Ambulatory Visit: Payer: Self-pay | Admitting: Internal Medicine

## 2017-12-16 ENCOUNTER — Encounter: Payer: Self-pay | Admitting: Internal Medicine

## 2018-10-12 ENCOUNTER — Encounter: Payer: Self-pay | Admitting: Internal Medicine

## 2019-01-04 MED ORDER — ONDANSETRON HCL 4 MG/2ML IJ SOLN
4.00 | INTRAMUSCULAR | Status: DC
Start: ? — End: 2019-01-04

## 2019-01-04 MED ORDER — CARBOXYMETHYLCELLULOSE SODIUM 0.5 % OP SOLN
1.00 | OPHTHALMIC | Status: DC
Start: ? — End: 2019-01-04

## 2019-01-04 MED ORDER — LORAZEPAM 2 MG/ML IJ SOLN
1.00 | INTRAMUSCULAR | Status: DC
Start: ? — End: 2019-01-04

## 2019-01-04 MED ORDER — LIDOCAINE 4 % EX PTCH
1.00 | MEDICATED_PATCH | CUTANEOUS | Status: DC
Start: 2019-01-04 — End: 2019-01-04

## 2019-01-04 MED ORDER — GLYCOPYRROLATE 0.2 MG/ML IJ SOLN
0.20 | INTRAMUSCULAR | Status: DC
Start: ? — End: 2019-01-04

## 2019-01-04 MED ORDER — HYDROMORPHONE HCL PF 1 MG/ML IJ SOLN
0.20 | INTRAMUSCULAR | Status: DC
Start: ? — End: 2019-01-04

## 2019-01-06 DEATH — deceased
